# Patient Record
Sex: Male | Born: 1990 | Race: White | Hispanic: No | Marital: Single
Health system: Southern US, Community
[De-identification: ages and names within clinical notes are randomized; demographics above are authoritative.]

---
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
## 2009-05-15 ENCOUNTER — Emergency Department (HOSPITAL_COMMUNITY): Admission: EM | Admit: 2009-05-15 | Discharge: 2009-05-15 | Payer: Self-pay | Admitting: Emergency Medicine

## 2009-11-24 ENCOUNTER — Encounter: Admission: RE | Admit: 2009-11-24 | Discharge: 2009-11-24 | Payer: Self-pay | Admitting: Family Medicine
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
## 2010-12-22 NOTE — Op Note (Signed)
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   PROCEDURE:  Bilateral sagittal osteotomies of the mandible with removal  of four impacted wisdom teeth.   SURGEON:  Hinton Dyer, D.D.S.   ASSISTANTS:  1. Ray Montez Morita.  2. Montel Culver.   ESTIMATED BLOOD LOSS:  Less than 200 mL.   CONDITION AT END OF SURGERY:  Good.   Following preoperative medication, the patient was brought to the  operating room in a supine position, which remained throughout the whole  procedure.  He was then intubated by right nasal endotracheal tube and  turned 90 degrees to the anesthesia cart. The face was prepared with  Betadine scrub and he was then draped in the usual fashion for an  orthognathic procedure.  The throat was suctioned out and a moist open 4  x 4 gauze had been placed at the time of the Betadine prep.  Xylocaine  2% 3 mL with 1:100,000 epinephrine was given as a block on the lower  right side and infiltration of the right maxilla and palate.  A 15 blade  made an incision over the right tuberosity and a periosteal elevator  reflected a full thickness mucoperiosteal flap.  Occlusal, buccal and  distal bone were removed with the rongeur to expose the impacted tooth  #1.  The tooth was then mobilized with an 11A elevator and then removed  from the socket with the same elevator.  The socket was curetted and  closed with a 3-0 chromic suture.  A 15 blade then made an incision down  the ascending  ramus to the first and second molar region.  A full  thickness mucoperiosteal flap was elevated with a periosteal elevator.  The ascending ramus was dissected free of the periosteum and an anterior  border stripper was used to remove the soft tissue up to the coronary  process.  The sigmoid notch was then located with a curved curet.  A  periosteal elevator developed a flap on the medial aspect of the  mandible so that a Hannahan could be placed to protect the soft tissues.  A reciprocating saw was then used to make a cut half way through the  bone parallel to the occlusal plane.  A 701 crosscut fissure bur was  then used to make bur holes down the ascending ramus to the first and  second molar region.  The same bur was used to connect all of the holes  together.  A 558 crosscut fissure bur was used to make a cut extending  from the inferior border to the original osteotomy site.  A channel  retractor was then  placed when this was being done.  At this point, a  spatula osteotome was then tapped along the bony cuts until slight  splaying could be achieved.  A sinker osteotome was then used which  began splaying the mandible nicely.  A [employer] manager  were then used to separate the 2 segments and the nerve was found to be  in the segment containing the teeth, now referred to as the distal  segment.  The split was finished with a fiber handle small osteotome.  A  J-stripper was run along the inferior border to free up the muscle  attachments.  The area was irrigated and a round bur and copious  irrigation were then used to locate the impacted third molar, #32.  Once  it could be visualized, a periosteal elevator was used to tease the  tooth out of the socket.  The socket was curetted and irrigated.  A  moist open 4 x 4 gauze was gently laid into the osteotomy site.  A 15  blade then made an incision over the left tuberosity after injecting  both the mucobuccal fold and  medial aspect of the mandible with 3 mL of  2% Xylocaine with 1:100,000 epinephrine.  After the incision was made, a  periosteal elevator reflected a full-thickness flap.  Occlusal buccal  and distal bone was again removed with the rongeur.  The tooth was  located and elevated out of the socket with an 11A elevator.  The socket  was curetted and closed with a 3-0 chromic suture.  A 15 blade then just  made an incision down the ascending ramus to the first and second molar  region.  A full thickness mucoperiosteal flap was elevated with the  periosteal elevator.  An anterior border stripper was used to reflect  the soft tissues up to the coronoid processs.  The sigmoid notch was  located with a curve curet.  On both sides, a self-retaining retractor  was used to separate the tissues and hold the coronoid process in place.  It was again placed onto the coronoid process so that a Hannahan could  be placed on the medial aspect.  A reciprocating saw made a cut parallel  to the occlusal plane 5 to 6 mm below the sigmoid notch.  This cut was  half way through the bone. A  701 crosscut fissure bur made bur holes  down the ascending ramus to the first and second molar region. They were  then connected together with the same bur.  A dissection was carried  down in the inferior border and a channel retractor was placed around  the inferior border.  A 556 crosscut fissure bur was used to make a cut  from the inferior border up to the original osteotomy site.  Once  completed, a spatula osteotome was then tapped along the cut until  slight splaying could be visualized.  This was followed by an increasing  size hand osteotome.  Good splaying was noted and this split was  finished with a Data processing manager.  The inferior portion  of the split was finished with a fiber handle small osteotome.  The  nerve was again found to be within the segment containing the teeth, now  referred to as the  distal segment.  A J-stripper ran around the inferior  border freeing it up.  The areas were irrigated and a round bur was then  used to locate the third molar,  exposing it.  A periosteal elevator  again teased the tooth out of the socket. The socket was curetted and  irrigated.   At this point, an acrylic wafer that had been made ahead of time to  predict the occlusion was placed against the maxillary and mandibular  teeth and they were held together using 24 gauge stainless steel IMF  wires.  Once in position, the right side was checked and found to lock  into place nicely.  An Allis clamp was then set and the segment was  placed posteriorly and superiorly and the Allis clamp was closed.  Three  screws were placed that were found to be stable, but a fourth screw was  placed because of the defect secondary to the third molar.  All four  screws were found to be stable.   Attention was then turned to the left side and, again, the segment was  laid against the distal segment.  Some bone had to be removed due to the  rotational position of the mandible.  This was trimmed with a round bur  and copious irrigation.  Again, the Allis clamp was set after placing  the condyle in the posterior-superior position.  Once closed down, four  screws were also placed on the right side, two 13 mm and two 11-mm  screws were placed, and on the left side, three 13 mm and one 11-mm  screw was placed.  Both sides were irrigated prior to putting the screws  in place.  Both sides were also tested after the screws were placed to  make sure they were stable and there was no loose segments.   At this point, the IMF wires were cut and the mandible was found to  passively rotated into the step.  Therefore, the soft tissues were  closed in a horizontal mattress fashion using multiple 4-0 Vicryl  sutures.  The throat pack was removed.  An NG tube was passed and the  patient was placed back in IMF using the acrylic  wafer and four IMF  wires.  A pressure bandage was placed on the patient.  The patient  tolerated the procedure well and was extubated on the table and returned  to the recovery room in good condition.           ______________________________  Hinton Dyer, D.D.S.    JLM/MEDQ  D:  04/04/2007  T:  04/04/2007  Job:  [id number]

## 2011-05-21 LAB — POCT HEMOGLOBIN-HEMACUE
Hemoglobin: 17.9 — ABNORMAL HIGH
Operator id: 116011

## 2022-06-05 ENCOUNTER — Encounter (HOSPITAL_COMMUNITY): Payer: Self-pay

## 2022-06-05 ENCOUNTER — Emergency Department (HOSPITAL_COMMUNITY)
Admission: EM | Admit: 2022-06-05 | Discharge: 2022-06-06 | Disposition: A | Discharge status: Home / Self Care | Payer: No Typology Code available for payment source | Attending: Emergency Medicine | Admitting: Emergency Medicine

## 2022-06-05 DIAGNOSIS — S02401A Maxillary fracture, unspecified, initial encounter for closed fracture: Secondary | ICD-10-CM

## 2022-06-05 DIAGNOSIS — S022XXB Fracture of nasal bones, initial encounter for open fracture: Secondary | ICD-10-CM

## 2022-06-05 DIAGNOSIS — S0992XA Unspecified injury of nose, initial encounter: Secondary | ICD-10-CM | POA: Diagnosis present

## 2022-06-05 DIAGNOSIS — Y9229 Other specified public building as the place of occurrence of the external cause: Secondary | ICD-10-CM | POA: Insufficient documentation

## 2022-06-05 DIAGNOSIS — S0121XA Laceration without foreign body of nose, initial encounter: Secondary | ICD-10-CM | POA: Diagnosis not present
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
## 2022-06-06 ENCOUNTER — Emergency Department (HOSPITAL_COMMUNITY): Payer: No Typology Code available for payment source

## 2022-06-06 DIAGNOSIS — S0121XA Laceration without foreign body of nose, initial encounter: Secondary | ICD-10-CM | POA: Diagnosis not present

## 2022-06-06 MED ORDER — LIDOCAINE HCL 1 % IJ SOLN
INTRAMUSCULAR | Status: AC
  Filled 2022-06-06: qty 20

## 2022-06-06 MED ORDER — CEPHALEXIN 500 MG PO CAPS
500.0000 mg | ORAL_CAPSULE | Freq: Once | ORAL | Status: AC
  Administered 2022-06-06: 500 mg via ORAL
  Filled 2022-06-06: qty 1

## 2022-06-06 MED ORDER — IBUPROFEN 800 MG PO TABS
800.0000 mg | ORAL_TABLET | Freq: Once | ORAL | Status: AC
Start: 2022-06-06 — End: 2022-06-06
  Administered 2022-06-06: 800 mg via ORAL
  Filled 2022-06-06: qty 1

## 2022-06-06 MED ORDER — CEPHALEXIN 500 MG PO CAPS: 500.0000 mg | ORAL_CAPSULE | Freq: Four times a day (QID) | ORAL | 0 refills | Status: AC
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
## 2022-06-06 NOTE — Discharge Instructions (Signed)
Begin taking Keflex as prescribed.  Local wound care with bacitracin and Band-Aid twice daily.  Ice for 20 minutes every 2 hours while awake for the next 2 days.  No forceful nose blowing.  Follow-up with ENT on Monday.  The contact information for Dr. Sabino Gasser has been provided in this discharge summary for you to call and make these arrangements.
